# Patient Record
Sex: Female | Born: 2000 | Race: White | Hispanic: No | Marital: Single | Smoking: Never smoker
Health system: Southern US, Community
[De-identification: ages and names within clinical notes are randomized; demographics above are authoritative.]

## PROBLEM LIST (undated history)

## (undated) DIAGNOSIS — L509 Urticaria, unspecified: Secondary | ICD-10-CM

## (undated) HISTORY — PX: TONSILLECTOMY: SUR1361

## (undated) HISTORY — DX: Urticaria, unspecified: L50.9

## (undated) HISTORY — PX: ADENOIDECTOMY: SUR15

---
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
## 2020-06-22 IMAGING — MR MR [PERSON_NAME]
11 series · 16 of 16 positions shown · non-contrast
Comparison: None.

CLINICAL DATA: Bilateral TMJ pain and difficulty chewing for 1
year. The patient is unable to open her mouth fully.

EXAM:
MRI OF TEMPOROMANDIBULAR JOINT WITHOUT CONTRAST
TECHNIQUE: Multiplanar, multisequence MR imaging of the temporomandibular joint
was performed following the standard protocol. No intravenous
contrast was administered.

[Series 4: T1 · axial · 4.0mm · 0.59mm/px · z∈[-48,+0]mm · 3 of 13 slices shown]
[im 1/13]
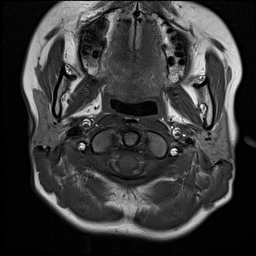
[im 7/13]
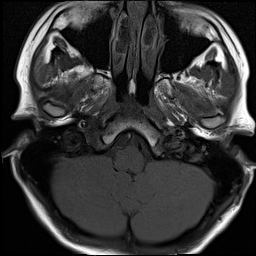
[im 13/13]
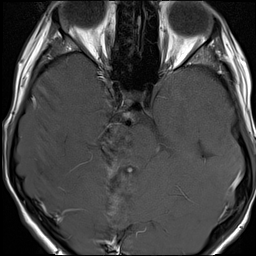

[Series 6: T2 fat-sat · sagittal · 4.0mm · 0.62mm/px · 2 of 9 slices shown (1 of 2)]
[im 1/9]
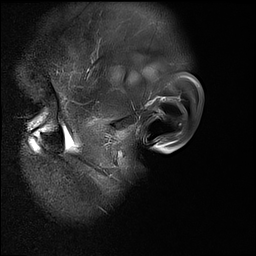
[im 9/9]
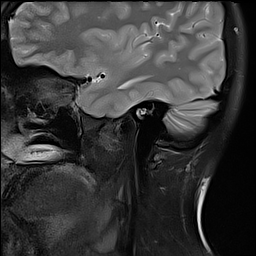

[Series 7: T2 fat-sat · sagittal · 4.0mm · 0.62mm/px · 2 of 9 slices shown (2 of 2)]
[im 1/9]
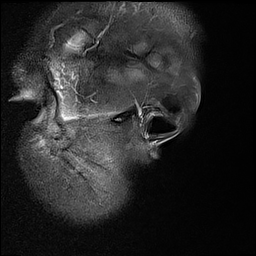
[im 9/9]
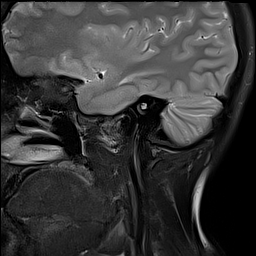

[Series 8: PD · sagittal · 4.0mm · 0.50mm/px · 2 of 9 slices shown (1 of 8)]
[im 1/9]
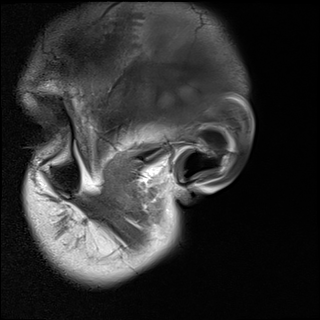
[im 9/9]
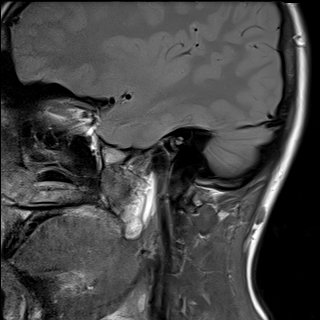

[Series 9: PD · sagittal · 4.0mm · 0.50mm/px · 1 of 9 slices shown (2 of 8)]
[im 1/9]
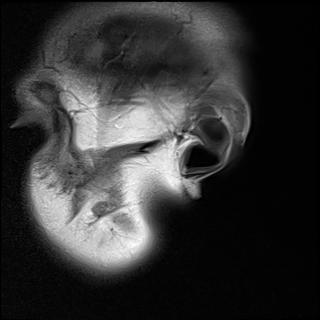

[Series 10: PD · coronal · 4.0mm · 0.44mm/px · 1 of 9 slices shown (3 of 8)]
[im 1/9]
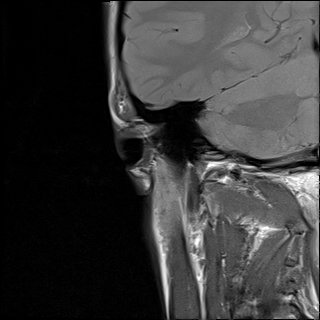

[Series 11: PD · oblique · 4.0mm · 0.44mm/px · 1 of 9 slices shown (4 of 8)]
[im 1/9]
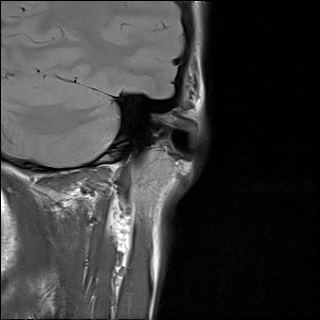

[Series 13: PD · sagittal · 4.0mm · 0.50mm/px · 1 of 9 slices shown (5 of 8)]
[im 1/9]
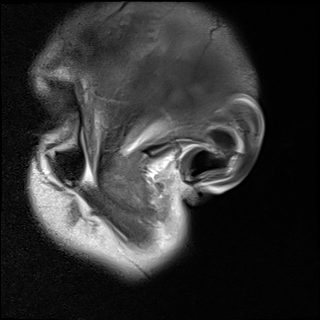

[Series 14: PD · sagittal · 4.0mm · 0.50mm/px · 1 of 9 slices shown (6 of 8)]
[im 1/9]
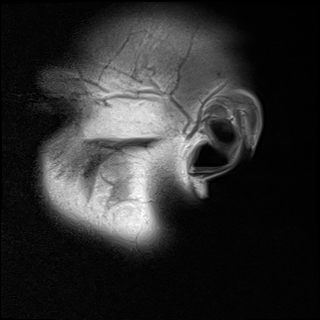

[Series 15: PD · oblique · 4.0mm · 0.44mm/px · 1 of 9 slices shown (7 of 8)]
[im 1/9]
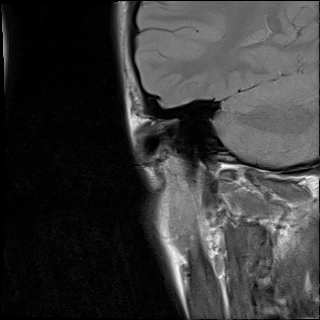

[Series 16: PD · oblique · 4.0mm · 0.44mm/px · 1 of 9 slices shown (8 of 8)]
[im 1/9]
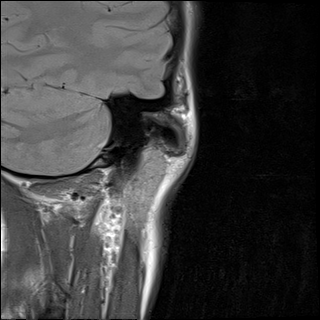

[16 of 16 positions shown; findings below may reference images not displayed]

FINDINGS: Right temporomandibular joint: The articular disc appears to be
extruded peripherally on image 3 of series 10. On the sagittal
images, the disc is anteriorly displaced off the condylar head. No
recapture is identified.With the mouth is open to 28 degrees, the
condylar head remains posterior to the condylar eminence.There is no
joint effusion. Marrow signal is normal.

Left temporomandibular joint: The articular disc is not visualized.
The condylar head remains posterior to the condylar eminence with 28
mm of mouth opening.There is no joint effusion. Marrow signal is
normal.

Other: Imaged intracranial contents appear normal. Imaged paranasal
sinuses are clear.
IMPRESSION: The left articular disc is not visualized on any sequence. The
condylar head remains posterior to the condylar eminence with 28 mm
of mouth opening.

The articular disc on the right is extruded peripherally and
anteriorly with the mouth closed without recapture on mouth opening.
The condylar head remains posterior to the condylar eminence with 20
mm of mouth opening.
